# Patient Record
Sex: Male | Born: 1980 | Race: Black or African American | Hispanic: No | Marital: Single | Smoking: Current every day smoker
Health system: Southern US, Community
[De-identification: ages and names within clinical notes are randomized; demographics above are authoritative.]

---

## 2007-12-08 ENCOUNTER — Emergency Department (HOSPITAL_COMMUNITY): Admission: EM | Admit: 2007-12-08 | Discharge: 2007-12-08 | Payer: Self-pay | Admitting: Emergency Medicine

## 2008-07-16 ENCOUNTER — Emergency Department (HOSPITAL_COMMUNITY): Admission: EM | Admit: 2008-07-16 | Discharge: 2008-07-16 | Payer: Self-pay | Admitting: Emergency Medicine

## 2013-12-17 ENCOUNTER — Emergency Department (HOSPITAL_COMMUNITY)
Admission: EM | Admit: 2013-12-17 | Discharge: 2013-12-18 | Disposition: A | Discharge status: Home / Self Care | Payer: Self-pay | Attending: Emergency Medicine | Admitting: Emergency Medicine

## 2013-12-17 ENCOUNTER — Encounter (HOSPITAL_COMMUNITY): Payer: Self-pay | Admitting: Emergency Medicine

## 2013-12-17 ENCOUNTER — Emergency Department (HOSPITAL_COMMUNITY): Payer: Self-pay

## 2013-12-17 DIAGNOSIS — R231 Pallor: Secondary | ICD-10-CM | POA: Insufficient documentation

## 2013-12-17 DIAGNOSIS — R1012 Left upper quadrant pain: Secondary | ICD-10-CM | POA: Insufficient documentation

## 2013-12-17 DIAGNOSIS — R42 Dizziness and giddiness: Secondary | ICD-10-CM | POA: Insufficient documentation

## 2013-12-17 DIAGNOSIS — R519 Headache, unspecified: Secondary | ICD-10-CM

## 2013-12-17 DIAGNOSIS — R61 Generalized hyperhidrosis: Secondary | ICD-10-CM | POA: Insufficient documentation

## 2013-12-17 DIAGNOSIS — R51 Headache: Secondary | ICD-10-CM | POA: Insufficient documentation

## 2013-12-17 DIAGNOSIS — E860 Dehydration: Secondary | ICD-10-CM | POA: Insufficient documentation

## 2013-12-17 DIAGNOSIS — R000 Tachycardia, unspecified: Secondary | ICD-10-CM | POA: Insufficient documentation

## 2013-12-17 LAB — CBC
HEMATOCRIT: 52.6 % — AB (ref 39.0–52.0)
HEMOGLOBIN: 18.3 g/dL — AB (ref 13.0–17.0)
MCH: 33.3 pg (ref 26.0–34.0)
MCHC: 34.8 g/dL (ref 30.0–36.0)
MCV: 95.6 fL (ref 78.0–100.0)
Platelets: 204 10*3/uL (ref 150–400)
RBC: 5.5 MIL/uL (ref 4.22–5.81)
RDW: 12.5 % (ref 11.5–15.5)
WBC: 17.6 10*3/uL — ABNORMAL HIGH (ref 4.0–10.5)

## 2013-12-17 LAB — BASIC METABOLIC PANEL
BUN: 14 mg/dL (ref 6–23)
CHLORIDE: 102 meq/L (ref 96–112)
CO2: 24 mEq/L (ref 19–32)
Calcium: 8.8 mg/dL (ref 8.4–10.5)
Creatinine, Ser: 1.39 mg/dL — ABNORMAL HIGH (ref 0.50–1.35)
GFR, EST AFRICAN AMERICAN: 75 mL/min — AB (ref >90)
GFR, EST NON AFRICAN AMERICAN: 65 mL/min — AB (ref >90)
GLUCOSE: 126 mg/dL — AB (ref 70–99)
POTASSIUM: 4.2 meq/L (ref 3.7–5.3)
SODIUM: 138 meq/L (ref 137–147)

## 2013-12-17 MED ORDER — METOCLOPRAMIDE HCL 5 MG/ML IJ SOLN
10.0000 mg | Freq: Once | INTRAMUSCULAR | Status: AC
  Administered 2013-12-17: 10 mg via INTRAVENOUS
  Filled 2013-12-17: qty 2

## 2013-12-17 MED ORDER — LORAZEPAM 2 MG/ML IJ SOLN
1.0000 mg | Freq: Once | INTRAMUSCULAR | Status: AC
  Administered 2013-12-17: 1 mg via INTRAVENOUS
  Filled 2013-12-17: qty 1

## 2013-12-17 MED ORDER — SODIUM CHLORIDE 0.9 % IV BOLUS (SEPSIS)
1000.0000 mL | Freq: Once | INTRAVENOUS | Status: AC
  Administered 2013-12-17: 1000 mL via INTRAVENOUS

## 2013-12-17 MED ORDER — ONDANSETRON HCL 4 MG/2ML IJ SOLN
4.0000 mg | Freq: Once | INTRAMUSCULAR | Status: AC
  Administered 2013-12-17: 4 mg via INTRAVENOUS

## 2013-12-17 MED ORDER — SODIUM CHLORIDE 0.9 % IV SOLN
Freq: Once | INTRAVENOUS | Status: AC
  Administered 2013-12-17: 20 mL/h via INTRAVENOUS

## 2013-12-17 NOTE — ED Notes (Signed)
Patient ambulates to br with steady gait

## 2013-12-17 NOTE — ED Provider Notes (Signed)
CSN: [id number]     Arrival date & time 12/17/13  2043 History   First MD Initiated Contact with Patient 12/17/13 2045     Chief Complaint  Patient presents with  . Dehydration  . Headache     (Consider location/radiation/quality/duration/timing/severity/associated sxs/prior Treatment) HPI History provided by pt.   Pt was taking a test this afternoon when he suddenly became lightheaded and diaphoretic.  Was experiencing simultaneous palpitations described as rapid HR as well.  A fellow student noticed that he wasn't feeling well and assisted him as he walked outside, and he reportedly had a syncopal episode.  He does not recall whether or not he lost consciousness.  His only complaint currently is that he feels cold.  Donated plasma this morning, though donated in the past and didn't feel this poorly afterwards.  No recent illnesses, including fever, cough, N/V/D, abd pain, urinary sx.  Has not had CP/SOB.  No PMH other than anxiety, but episode today was different than panic attacks he has had in the past.  No past medical history on file. No past surgical history on file. No family history on file. History  Substance Use Topics  . Smoking status: Not on file  . Smokeless tobacco: Not on file  . Alcohol Use: Not on file    Review of Systems  All other systems reviewed and are negative.     Allergies  Review of patient's allergies indicates not on file.  Home Medications   Prior to Admission medications   Not on File   BP 111/74  Pulse 69  Temp(Src) 97.9 F (36.6 C) (Oral)  Resp 18  SpO2 99% Physical Exam  Nursing note and vitals reviewed. Constitutional: He is oriented to person, place, and time. He appears well-developed and well-nourished.  Uncomfortable appearing.    HENT:  Head: Normocephalic and atraumatic.  Mouth/Throat: Oropharynx is clear and moist.  Eyes: Conjunctivae are normal.  Normal appearance  Neck: Normal range of motion.  Cardiovascular: Normal  rate and regular rhythm.   Pulmonary/Chest: Effort normal and breath sounds normal. No respiratory distress.  Abdominal: Soft. Bowel sounds are normal. He exhibits no distension.  Mild tenderness LUQ  Musculoskeletal: Normal range of motion.  Neurological: He is alert and oriented to person, place, and time.  Skin: Skin is warm and dry. There is pallor.  Psychiatric: He has a normal mood and affect. His behavior is normal.    ED Course  Procedures (including critical care time) Labs Review Labs Reviewed  BASIC METABOLIC PANEL - Abnormal; Notable for the following:    Glucose, Bld 126 (*)    Creatinine, Ser 1.39 (*)    GFR calc non Af Amer 65 (*)    GFR calc Af Amer 75 (*)    All other components within normal limits  CBC - Abnormal; Notable for the following:    WBC 17.6 (*)    Hemoglobin 18.3 (*)    HCT 52.6 (*)    All other components within normal limits    Imaging Review Ct Head Wo Contrast  12/18/2013   CLINICAL DATA:  Headaches  EXAM: CT HEAD WITHOUT CONTRAST  TECHNIQUE: Contiguous axial images were obtained from the base of the skull through the vertex without intravenous contrast.  COMPARISON:  07/16/2008  FINDINGS: Bony calvarium is intact. The ventricles are normal in size and configuration. No findings to suggest acute hemorrhage, acute infarction or space-occupying mass lesion are noted.  IMPRESSION: No acute abnormality noted.   Electronically  Signed   By: Alcide CleverMark  Lukens M.D.   On: 12/18/2013 00:11     EKG Interpretation None      MDM   Final diagnoses:  Dehydration  Headache    32yo healthy M presents w/ possible syncope.  Preceded by lightheadedness, diaphoresis and tachycardia.  No CP/SOB.  Donated plasma this morning.  Only complaint currently is feeling cold.  On exam, VS w/in nml range, uncomfortable appearing and pale, nml conjunctiva, well-hydrated, heart w/ RRR, lungs clear.   Pt receiving IVF.  EKG unremarkable.  H&H elevated, as to be expected.  BMP  pending. 9:46 PM   Pt has received 2L NS and VS and symptoms stable.  Complaining of headache.  No focal neuro deficits or meningismus on repeat exam.  Cr elevated, likely secondary to dehydration. Will treat w/ IV reglan and third liter NS.  Dr. Criss AlvineGoldston to see.  11:07 PM   CT head ordered and was negative for acute process.   Results discussed w/ pt.  His headache improved with reglan and then even more so w/ IV toradol.  VSS, he has ambulated to and from bathroom independently and he feels well enough to go home.  I advised rest and fluids.  Return precautions discussed.   Otilio Miuatherine E Ula Couvillon, PA-C 12/18/13 [phone]

## 2013-12-17 NOTE — ED Notes (Addendum)
[facility]. [facility] hot and sweaty, also mentioned HA and nausea". Pt encouraged by bystander to go outside and walk around. Pt awake, poor interaction, poor concentration & communication. Skin W& clammy. NSL placed by EMS. No fluids or meds given. No emesis or vomiting for EMS PTA. VSS. H/o similar in March. Pt since mentions to ED staff that he gave plasma today.

## 2013-12-17 NOTE — ED Notes (Signed)
EDPA into see & assess pt, [facility]

## 2013-12-17 NOTE — ED Notes (Signed)
Pt calmer, resting, RR18, "feels better after vomiting".

## 2013-12-17 NOTE — ED Notes (Addendum)
Sleeping/ resting, arousable to voice and entering room, NAD, calm, VSS, IVF w.o. C/o "feeling cold", won't keep arms straight, some erroneous BPS noted d/t bent arms.

## 2013-12-17 NOTE — ED Notes (Signed)
Pt sitting up diaphoretic, anxious, hyperventilating, c/o "being cold, having HA, BP cuff hurting", pt vomited gastric undigested food.

## 2013-12-17 NOTE — ED Notes (Signed)
Pt calmer, resting on O2 and monitor, VSS, NAD, passively interactive, poor effort at communication, IVF w.o. pending lab results, EDPA CS in to see pt.

## 2013-12-18 MED ORDER — KETOROLAC TROMETHAMINE 30 MG/ML IJ SOLN
30.0000 mg | Freq: Once | INTRAMUSCULAR | Status: AC
  Administered 2013-12-18: 30 mg via INTRAVENOUS
  Filled 2013-12-18: qty 1

## 2013-12-18 NOTE — Discharge Instructions (Signed)
Get rest and drink plenty of fluids.  Take tylenol or ibuprofen as needed for headache.  Return to the ER if you develop fever, worsening headache, chest pain, shortness of breath, more passing out spells.   Dehydration, Adult Dehydration is when you lose more fluids from the body than you take in. Vital organs like the kidneys, brain, and heart cannot function without a proper amount of fluids and salt. Any loss of fluids from the body can cause dehydration.  CAUSES   Vomiting.  Diarrhea.  Excessive sweating.  Excessive urine output.  Fever. SYMPTOMS  Mild dehydration  Thirst.  Dry lips.  Slightly dry mouth. Moderate dehydration  Very dry mouth.  Sunken eyes.  Skin does not bounce back quickly when lightly pinched and released.  Dark urine and decreased urine production.  Decreased tear production.  Headache. Severe dehydration  Very dry mouth.  Extreme thirst.  Rapid, weak pulse (more than 100 beats per minute at rest).  Cold hands and feet.  Not able to sweat in spite of heat and temperature.  Rapid breathing.  Blue lips.  Confusion and lethargy.  Difficulty being awakened.  Minimal urine production.  No tears. DIAGNOSIS  Your caregiver will diagnose dehydration based on your symptoms and your exam. Blood and urine tests will help confirm the diagnosis. The diagnostic evaluation should also identify the cause of dehydration. TREATMENT  Treatment of mild or moderate dehydration can often be done at home by increasing the amount of fluids that you drink. It is best to drink small amounts of fluid more often. Drinking too much at one time can make vomiting worse. Refer to the home care instructions below. Severe dehydration needs to be treated at the hospital where you will probably be given intravenous (IV) fluids that contain water and electrolytes. HOME CARE INSTRUCTIONS   Ask your caregiver about specific rehydration instructions.  Drink enough  fluids to keep your urine clear or pale yellow.  Drink small amounts frequently if you have nausea and vomiting.  Eat as you normally do.  Avoid:  Foods or drinks high in sugar.  Carbonated drinks.  Juice.  Extremely hot or cold fluids.  Drinks with caffeine.  Fatty, greasy foods.  Alcohol.  Tobacco.  Overeating.  Gelatin desserts.  Wash your hands well to avoid spreading bacteria and viruses.  Only take over-the-counter or prescription medicines for pain, discomfort, or fever as directed by your caregiver.  Ask your caregiver if you should continue all prescribed and over-the-counter medicines.  Keep all follow-up appointments with your caregiver. SEEK MEDICAL CARE IF:  You have abdominal pain and it increases or stays in one area (localizes).  You have a rash, stiff neck, or severe headache.  You are irritable, sleepy, or difficult to awaken.  You are weak, dizzy, or extremely thirsty. SEEK IMMEDIATE MEDICAL CARE IF:   You are unable to keep fluids down or you get worse despite treatment.  You have frequent episodes of vomiting or diarrhea.  You have blood or green matter (bile) in your vomit.  You have blood in your stool or your stool looks black and tarry.  You have not urinated in 6 to 8 hours, or you have only urinated a small amount of very dark urine.  You have a fever.  You faint. MAKE SURE YOU:   Understand these instructions.  Will watch your condition.  Will get help right away if you are not doing well or get worse. Document Released: 07/26/2005 Document Revised:  10/18/2011 Document Reviewed: 03/15/2011 ExitCare Patient Information 2014 [place], [place].

## 2013-12-20 NOTE — ED Provider Notes (Signed)
Medical screening examination/treatment/procedure(s) were conducted as a shared visit with non-physician practitioner(s) and myself.  I personally evaluated the patient during the encounter.   EKG Interpretation   Date/Time:  Monday Dec 17 2013 21:35:03 EDT Ventricular Rate:  64 PR Interval:  170 QRS Duration: 91 QT Interval:  414 QTC Calculation: 427 R Axis:   43 Text Interpretation:  Sinus arrhythmia ST elev, probable normal early  repol pattern No old tracing to compare Confirmed by Kodee Ravert  MD, Corby Vandenberghe  ([id number]) on 12/17/2013 9:48:53 PM       Patient with near-syncope vs syncope and a headache. Only thing that worsens headache according to patient is when he is cold/blankets are taken off. Neurologically intact. Most likely feels headache and near-syncopal from giving plasma today and having a test today. Doubt serious acute intracranial pathology causing his headache. Hydrated, will discharge with return precautions.  Audree CamelScott T Drae Mitzel, MD 12/20/13 [phone]

## 2014-01-16 ENCOUNTER — Encounter (HOSPITAL_COMMUNITY): Payer: Self-pay | Admitting: Emergency Medicine

## 2014-01-16 ENCOUNTER — Emergency Department (HOSPITAL_COMMUNITY): Payer: Self-pay

## 2014-01-16 ENCOUNTER — Emergency Department (HOSPITAL_COMMUNITY)
Admission: EM | Admit: 2014-01-16 | Discharge: 2014-01-17 | Disposition: A | Discharge status: Home / Self Care | Payer: Self-pay | Attending: Emergency Medicine | Admitting: Emergency Medicine

## 2014-01-16 DIAGNOSIS — R61 Generalized hyperhidrosis: Secondary | ICD-10-CM | POA: Insufficient documentation

## 2014-01-16 DIAGNOSIS — R51 Headache: Secondary | ICD-10-CM | POA: Insufficient documentation

## 2014-01-16 DIAGNOSIS — E860 Dehydration: Secondary | ICD-10-CM | POA: Insufficient documentation

## 2014-01-16 DIAGNOSIS — F17200 Nicotine dependence, unspecified, uncomplicated: Secondary | ICD-10-CM | POA: Insufficient documentation

## 2014-01-16 LAB — URINALYSIS, ROUTINE W REFLEX MICROSCOPIC
Bilirubin Urine: NEGATIVE
Glucose, UA: NEGATIVE mg/dL
HGB URINE DIPSTICK: NEGATIVE
Ketones, ur: 15 mg/dL — AB
LEUKOCYTES UA: NEGATIVE
Nitrite: NEGATIVE
PH: 6.5 (ref 5.0–8.0)
Protein, ur: NEGATIVE mg/dL
SPECIFIC GRAVITY, URINE: 1.023 (ref 1.005–1.030)
UROBILINOGEN UA: 1 mg/dL (ref 0.0–1.0)

## 2014-01-16 LAB — CBC WITH DIFFERENTIAL/PLATELET
BASOS ABS: 0 10*3/uL (ref 0.0–0.1)
Basophils Relative: 0 % (ref 0–1)
EOS PCT: 0 % (ref 0–5)
Eosinophils Absolute: 0.1 10*3/uL (ref 0.0–0.7)
HEMATOCRIT: 54.7 % — AB (ref 39.0–52.0)
HEMOGLOBIN: 19.4 g/dL — AB (ref 13.0–17.0)
LYMPHS ABS: 2.9 10*3/uL (ref 0.7–4.0)
LYMPHS PCT: 20 % (ref 12–46)
MCH: 33.2 pg (ref 26.0–34.0)
MCHC: 35.5 g/dL (ref 30.0–36.0)
MCV: 93.7 fL (ref 78.0–100.0)
MONO ABS: 1.3 10*3/uL — AB (ref 0.1–1.0)
MONOS PCT: 9 % (ref 3–12)
NEUTROS ABS: 10.5 10*3/uL — AB (ref 1.7–7.7)
Neutrophils Relative %: 71 % (ref 43–77)
Platelets: 225 10*3/uL (ref 150–400)
RBC: 5.84 MIL/uL — ABNORMAL HIGH (ref 4.22–5.81)
RDW: 12.4 % (ref 11.5–15.5)
WBC: 14.7 10*3/uL — AB (ref 4.0–10.5)

## 2014-01-16 LAB — LACTIC ACID, PLASMA
LACTIC ACID, VENOUS: 3.7 mmol/L — AB (ref 0.5–2.2)
Lactic Acid, Venous: 6.3 mmol/L — ABNORMAL HIGH (ref 0.5–2.2)

## 2014-01-16 LAB — BASIC METABOLIC PANEL
BUN: 10 mg/dL (ref 6–23)
BUN: 10 mg/dL (ref 6–23)
CALCIUM: 9.3 mg/dL (ref 8.4–10.5)
CALCIUM: 8.2 mg/dL — AB (ref 8.4–10.5)
CO2: 15 meq/L — AB (ref 19–32)
CO2: 20 meq/L (ref 19–32)
CREATININE: 1.16 mg/dL (ref 0.50–1.35)
Chloride: 99 mEq/L (ref 96–112)
Chloride: 106 mEq/L (ref 96–112)
Creatinine, Ser: 1.3 mg/dL (ref 0.50–1.35)
GFR calc non Af Amer: 70 mL/min — ABNORMAL LOW (ref >90)
GFR, EST AFRICAN AMERICAN: 82 mL/min — AB (ref >90)
GFR, EST NON AFRICAN AMERICAN: 81 mL/min — AB (ref >90)
Glucose, Bld: 160 mg/dL — ABNORMAL HIGH (ref 70–99)
Glucose, Bld: 109 mg/dL — ABNORMAL HIGH (ref 70–99)
Potassium: 3.4 mEq/L — ABNORMAL LOW (ref 3.7–5.3)
Potassium: 4.2 mEq/L (ref 3.7–5.3)
SODIUM: 140 meq/L (ref 137–147)
SODIUM: 139 meq/L (ref 137–147)

## 2014-01-16 LAB — RAPID URINE DRUG SCREEN, HOSP PERFORMED
AMPHETAMINES: NOT DETECTED
BARBITURATES: NOT DETECTED
Benzodiazepines: NOT DETECTED
Cocaine: NOT DETECTED
Opiates: NOT DETECTED
Tetrahydrocannabinol: POSITIVE — AB

## 2014-01-16 LAB — I-STAT CHEM 8, ED
BUN: 9 mg/dL (ref 6–23)
CALCIUM ION: 1.03 mmol/L — AB (ref 1.12–1.23)
CHLORIDE: 104 meq/L (ref 96–112)
CREATININE: 1.3 mg/dL (ref 0.50–1.35)
GLUCOSE: 151 mg/dL — AB (ref 70–99)
HEMATOCRIT: 58 % — AB (ref 39.0–52.0)
Hemoglobin: 19.7 g/dL — ABNORMAL HIGH (ref 13.0–17.0)
POTASSIUM: 2.9 meq/L — AB (ref 3.7–5.3)
Sodium: 137 mEq/L (ref 137–147)
TCO2: 16 mmol/L (ref 0–100)

## 2014-01-16 LAB — ETHANOL

## 2014-01-16 LAB — CBG MONITORING, ED: GLUCOSE-CAPILLARY: 137 mg/dL — AB (ref 70–99)

## 2014-01-16 MED ORDER — ACETAMINOPHEN 500 MG PO TABS
1000.0000 mg | ORAL_TABLET | Freq: Once | ORAL | Status: AC
  Administered 2014-01-16: 1000 mg via ORAL
  Filled 2014-01-16: qty 2

## 2014-01-16 MED ORDER — SODIUM CHLORIDE 0.9 % IV BOLUS (SEPSIS)
1000.0000 mL | Freq: Once | INTRAVENOUS | Status: AC
  Administered 2014-01-16: 1000 mL via INTRAVENOUS

## 2014-01-16 MED ORDER — LORAZEPAM 2 MG/ML IJ SOLN
0.5000 mg | Freq: Once | INTRAMUSCULAR | Status: AC
  Administered 2014-01-16: 0.5 mg via INTRAVENOUS
  Filled 2014-01-16: qty 1

## 2014-01-16 MED ORDER — KETOROLAC TROMETHAMINE 30 MG/ML IJ SOLN
30.0000 mg | Freq: Once | INTRAMUSCULAR | Status: AC
  Administered 2014-01-16: 30 mg via INTRAVENOUS
  Filled 2014-01-16: qty 1

## 2014-01-16 NOTE — ED Notes (Signed)
Pt's cousin Kem Boroughs [phone]

## 2014-01-16 NOTE — ED Notes (Addendum)
Patient yelling out "hello, Hello" Patient stated, "My head hurts so bad and I'm so cold." patient also informed writer that he had donated plasma today. EDPA notified.

## 2014-01-16 NOTE — ED Notes (Signed)
Patient yelled for a nurse to come into the room. Patient stated he had a headache in the middle of his head x 5 days. Patient also elaborated and said he had a history fo migraine headache.

## 2014-01-16 NOTE — ED Notes (Signed)
Pt requested to contact his parents.  Phone given to pt, to speak to his father David Clay

## 2014-01-16 NOTE — ED Notes (Signed)
Assisted pt to a standing position to use the urinal.  No difficulty.  Pt continues to c/o feeling cold.  VS remains WNL.  Pt's brother at bedside.

## 2014-01-16 NOTE — ED Notes (Signed)
Pt was hyperventilating.  Instructed pt to slow his breathing down.

## 2014-01-16 NOTE — ED Notes (Signed)
Per EMS: Pt unwilling/unable to talk about what is wrong.  Pt pale, diaphoretic.  Breathing 70 times per minute upon EMS arrival.  Spasms in hand which have since subsided.  Pt unwilling to follow commands.  Pt was in the shower and walked to the door to let EMS in.  Pt then proceeded to fall to the ground and hyperventilate.  Pt given an emesis bag but proceeded to act like he was going to throw up on the floor.  Pt reacts severely to ammonia inhalant but still will not answer questions.  Normal cbg.

## 2014-01-16 NOTE — ED Notes (Signed)
Family at bedside.  Brother

## 2014-01-16 NOTE — ED Notes (Addendum)
Family's contact is his brother, JT's phone is [phone]

## 2014-01-16 NOTE — ED Provider Notes (Signed)
CSN: [id number]     Arrival date & time 01/16/14  1636 History   First MD Initiated Contact with Patient 01/16/14 1658     Chief Complaint  Patient presents with  . Fatigue  . Diaphoresis      (Consider location/radiation/quality/duration/timing/severity/associated sxs/prior Treatment) The history is provided by the patient and the EMS personnel. The history is limited by the condition of the patient.    David Clay is a 33 y.o. male presenting for evaluation of weakness.  Per ems, he was in the shower at his home when ems first arrived,  He walked to the door, let them in, then fell to the ground hyperventilating, but was unwilling or unable to give history of symptoms.  Upon first arrival,  He was diaphoretic but became chilled and currently has tremors. His only complaint is for a 5 day history of headache.  He initially was not cooperative with giving a history, but did respond appropriately to ammonia inhalant.    Level 5 caveat due to patient condition      History reviewed. No pertinent past medical history. No past surgical history on file. No family history on file. History  Substance Use Topics  . Smoking status: Current Every Day Smoker  . Smokeless tobacco: Not on file  . Alcohol Use: Yes    Review of Systems  Unable to perform ROS Neurological: Positive for headaches.      Allergies  Review of patient's allergies indicates no known allergies.  Home Medications   Prior to Admission medications   Not on File   BP 116/68  Pulse 94  Temp(Src) 97.4 F (36.3 C) (Rectal)  Resp 24  SpO2 100% Physical Exam  Nursing note and vitals reviewed. Constitutional: He appears well-developed and well-nourished. He appears lethargic.  HENT:  Head: Normocephalic and atraumatic.  Eyes: Conjunctivae are normal.  Neck: Normal range of motion.  Cardiovascular: Normal rate, regular rhythm, normal heart sounds and intact distal pulses.   Pulmonary/Chest: Effort normal  and breath sounds normal. He has no wheezes.  Abdominal: Soft. Bowel sounds are normal. There is no tenderness.  Musculoskeletal: Normal range of motion.  Neurological: He appears lethargic. GCS eye subscore is 3.  Skin: Skin is dry and intact. He is not diaphoretic.  Psychiatric: He is withdrawn.    ED Course  Procedures (including critical care time)   2 hours into ed course, patient states he gave plasma today.  Review of chart,  He was here last month with similar sx after giving plasma.  He was given IV fluids,  Will repeat abnormal labs to see if fluids will reverse some of these abnormalities, specifically lactic acid, rbc's.  Will also recheck k+ level to confirm hypokalemia.  Labs Review Labs Reviewed  CBC WITH DIFFERENTIAL - Abnormal; Notable for the following:    WBC 14.7 (*)    RBC 5.84 (*)    Hemoglobin 19.4 (*)    HCT 54.7 (*)    Neutro Abs 10.5 (*)    Monocytes Absolute 1.3 (*)    All other components within normal limits  LACTIC ACID, PLASMA - Abnormal; Notable for the following:    Lactic Acid, Venous 6.3 (*)    All other components within normal limits  URINALYSIS, ROUTINE W REFLEX MICROSCOPIC - Abnormal; Notable for the following:    Color, Urine AMBER (*)    Ketones, ur 15 (*)    All other components within normal limits  URINE RAPID DRUG SCREEN (HOSP PERFORMED) -  Abnormal; Notable for the following:    Tetrahydrocannabinol POSITIVE (*)    All other components within normal limits  BASIC METABOLIC PANEL - Abnormal; Notable for the following:    Potassium 3.4 (*)    CO2 15 (*)    Glucose, Bld 160 (*)    GFR calc non Af Amer 70 (*)    GFR calc Af Amer 82 (*)    All other components within normal limits  LACTIC ACID, PLASMA - Abnormal; Notable for the following:    Lactic Acid, Venous 3.7 (*)    All other components within normal limits  BASIC METABOLIC PANEL - Abnormal; Notable for the following:    Glucose, Bld 109 (*)    Calcium 8.2 (*)    GFR calc non  Af Amer 81 (*)    All other components within normal limits  CBG MONITORING, ED - Abnormal; Notable for the following:    Glucose-Capillary 137 (*)    All other components within normal limits  I-STAT CHEM 8, ED - Abnormal; Notable for the following:    Potassium 2.9 (*)    Glucose, Bld 151 (*)    Calcium, Ion 1.03 (*)    Hemoglobin 19.7 (*)    HCT 58.0 (*)    All other components within normal limits  ETHANOL  LACTIC ACID, PLASMA    Imaging Review Ct Head Wo Contrast  01/16/2014   CLINICAL DATA:  Headache.  Fatigue.  EXAM: CT HEAD WITHOUT CONTRAST  TECHNIQUE: Contiguous axial images were obtained from the base of the skull through the vertex without intravenous contrast.  COMPARISON:  12/17/2013  FINDINGS: No mass lesion. No midline shift. No acute hemorrhage or hematoma. No extra-axial fluid collections. No evidence of acute infarction. Brain parenchyma is normal. Ventricles are normal. No osseous abnormality.  IMPRESSION: Normal exam.   Electronically Signed   By: Geanie Cooley M.D.   On: 01/16/2014 18:24     EKG Interpretation None      MDM   Final diagnoses:  Dehydration     Pt received 3 Liters of NS,  Lactic acid trended to normal range.  He was ambulatory in dept and tolerated PO fluids.  Stable at time of dc.  K+ recheck normal range.  He was stable at time of dc.  Advised to avoid giving plasma in the future, given reaction to this donation.    Burgess Amor, PA-C 01/17/14 [phone]

## 2014-01-17 LAB — LACTIC ACID, PLASMA: Lactic Acid, Venous: 1 mmol/L (ref 0.5–2.2)

## 2014-01-17 NOTE — Discharge Instructions (Signed)
Dehydration, Adult Dehydration is when you lose more fluids from the body than you take in. Vital organs like the kidneys, brain, and heart cannot function without a proper amount of fluids and salt. Any loss of fluids from the body can cause dehydration.  CAUSES   Vomiting.  Diarrhea.  Excessive sweating.  Excessive urine output.  Fever. SYMPTOMS  Mild dehydration  Thirst.  Dry lips.  Slightly dry mouth. Moderate dehydration  Very dry mouth.  Sunken eyes.  Skin does not bounce back quickly when lightly pinched and released.  Dark urine and decreased urine production.  Decreased tear production.  Headache. Severe dehydration  Very dry mouth.  Extreme thirst.  Rapid, weak pulse (more than 100 beats per minute at rest).  Cold hands and feet.  Not able to sweat in spite of heat and temperature.  Rapid breathing.  Blue lips.  Confusion and lethargy.  Difficulty being awakened.  Minimal urine production.  No tears. DIAGNOSIS  Your caregiver will diagnose dehydration based on your symptoms and your exam. Blood and urine tests will help confirm the diagnosis. The diagnostic evaluation should also identify the cause of dehydration. TREATMENT  Treatment of mild or moderate dehydration can often be done at home by increasing the amount of fluids that you drink. It is best to drink small amounts of fluid more often. Drinking too much at one time can make vomiting worse. Refer to the home care instructions below. Severe dehydration needs to be treated at the hospital where you will probably be given intravenous (IV) fluids that contain water and electrolytes. HOME CARE INSTRUCTIONS   Ask your caregiver about specific rehydration instructions.  Drink enough fluids to keep your urine clear or pale yellow.  Drink small amounts frequently if you have nausea and vomiting.  Eat as you normally do.  Avoid:  Foods or drinks high in sugar.  Carbonated  drinks.  Juice.  Extremely hot or cold fluids.  Drinks with caffeine.  Fatty, greasy foods.  Alcohol.  Tobacco.  Overeating.  Gelatin desserts.  Wash your hands well to avoid spreading bacteria and viruses.  Only take over-the-counter or prescription medicines for pain, discomfort, or fever as directed by your caregiver.  Ask your caregiver if you should continue all prescribed and over-the-counter medicines.  Keep all follow-up appointments with your caregiver. SEEK MEDICAL CARE IF:  You have abdominal pain and it increases or stays in one area (localizes).  You have a rash, stiff neck, or severe headache.  You are irritable, sleepy, or difficult to awaken.  You are weak, dizzy, or extremely thirsty. SEEK IMMEDIATE MEDICAL CARE IF:   You are unable to keep fluids down or you get worse despite treatment.  You have frequent episodes of vomiting or diarrhea.  You have blood or green matter (bile) in your vomit.  You have blood in your stool or your stool looks black and tarry.  You have not urinated in 6 to 8 hours, or you have only urinated a small amount of very dark urine.  You have a fever.  You faint. MAKE SURE YOU:   Understand these instructions.  Will watch your condition.  Will get help right away if you are not doing well or get worse. Document Released: 07/26/2005 Document Revised: 10/18/2011 Document Reviewed: 03/15/2011 [facility]ExitCare Patient Information 2014 [place], [place].   Your symptoms today are directly related to donating plasma, which you do not tolerate well and is not healthy for you.  You should not donate  plasma again.

## 2014-01-17 NOTE — ED Notes (Signed)
Pt was ambulatory to bathroom with steady gait. Breathing was unlabored and even. Pt states he feels better, not as cold as earlier and only has a slight headache now.

## 2014-01-20 NOTE — ED Provider Notes (Signed)
Medical screening examination/treatment/procedure(s) were conducted as a shared visit with non-physician practitioner(s) and myself.  I personally evaluated the patient during the encounter.   EKG Interpretation None       Patient with headache and feeling bad after giving plasma. Similar event 1 month ago. Symptoms improved with fluid rehydration. Initially had lactic acidosis, will recheck after fluids and discharge.  Audree CamelScott T Ashlyne Olenick, MD 01/20/14 [phone]

## 2014-09-16 ENCOUNTER — Ambulatory Visit: Payer: Self-pay

## 2015-05-01 IMAGING — CT CT HEAD W/O CM
2 series · 16 of 30 positions shown, 20 images · non-contrast
Comparison: 07/16/2008

CLINICAL DATA: Headaches

EXAM:
CT HEAD WITHOUT CONTRAST
TECHNIQUE: Contiguous axial images were obtained from the base of the skull
through the vertex without intravenous contrast.

[Series 201: head w/o, idose (1) · axial · non-contrast · 0.45mm/px · z∈[+106,+231]mm · 13 of 31 slices shown, 17 images]
[im 3/31  brain]
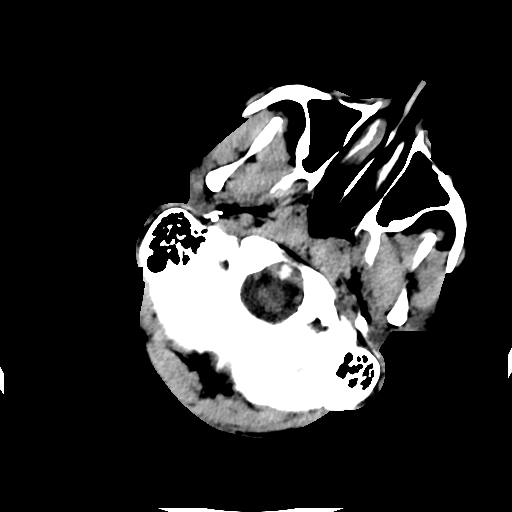
[im 3/31  bone]
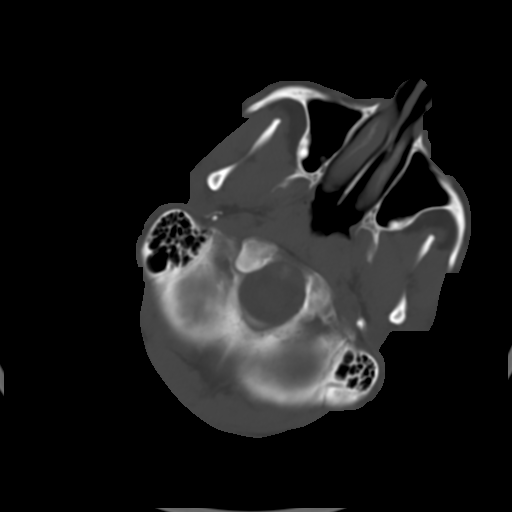
[im 5/31  brain]
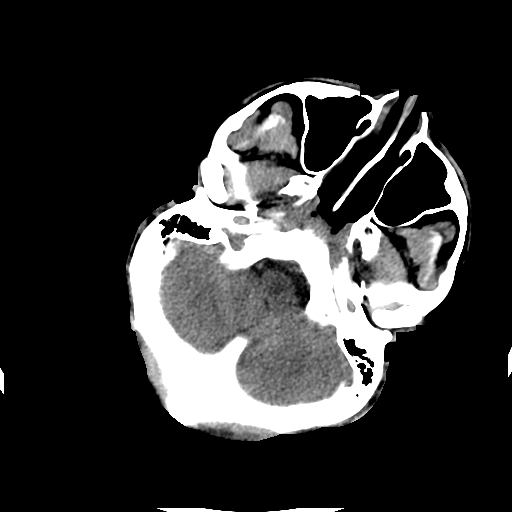
[im 7/31  brain]
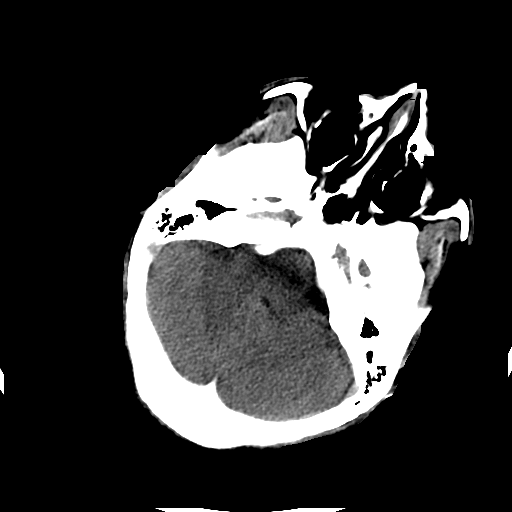
[im 9/31  brain]
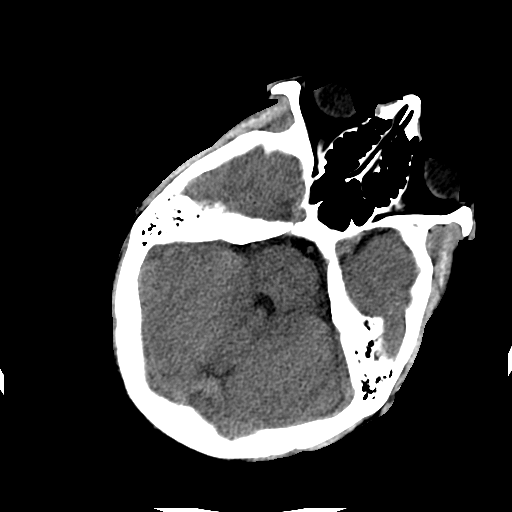
[im 11/31  brain]
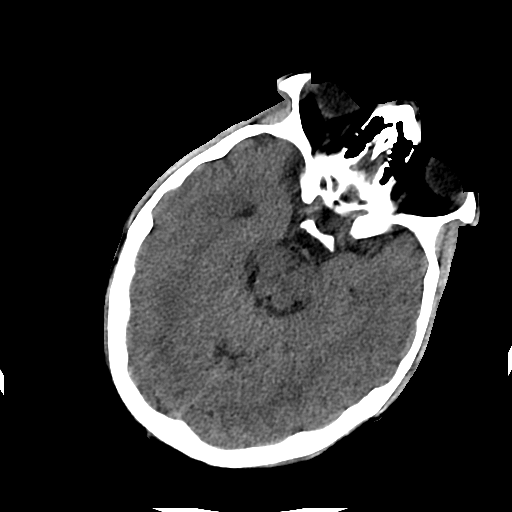
[im 11/31  bone]
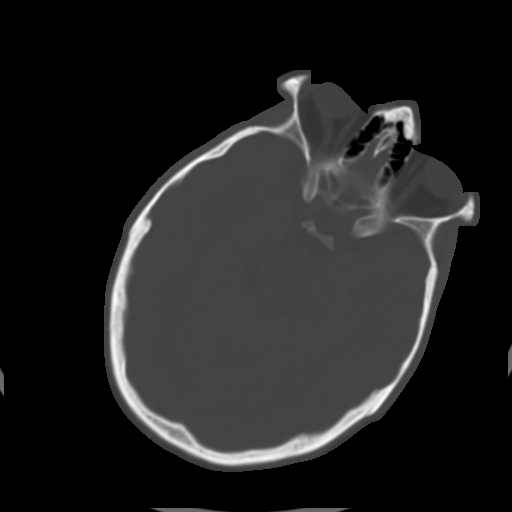
[im 13/31  brain]
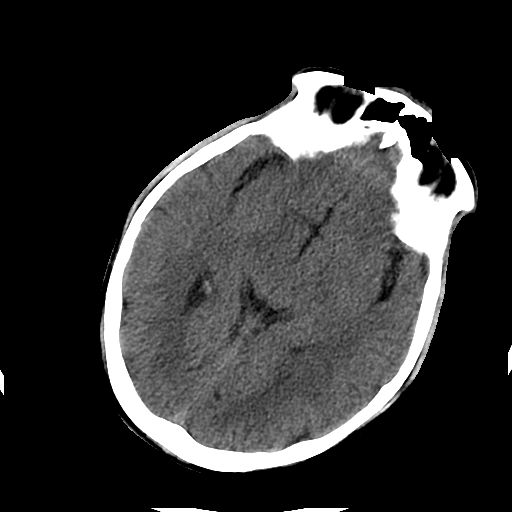
[im 16/31  brain]
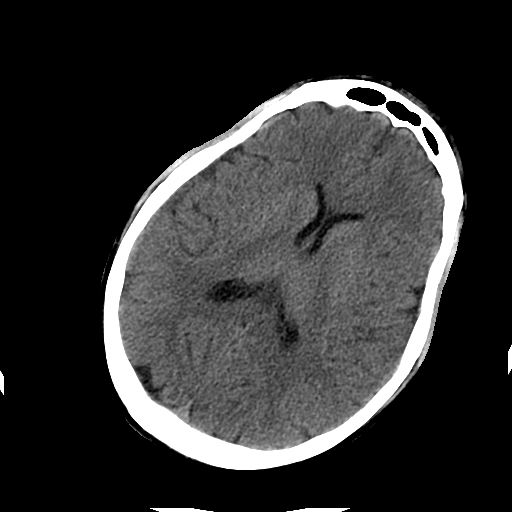
[im 18/31  brain]
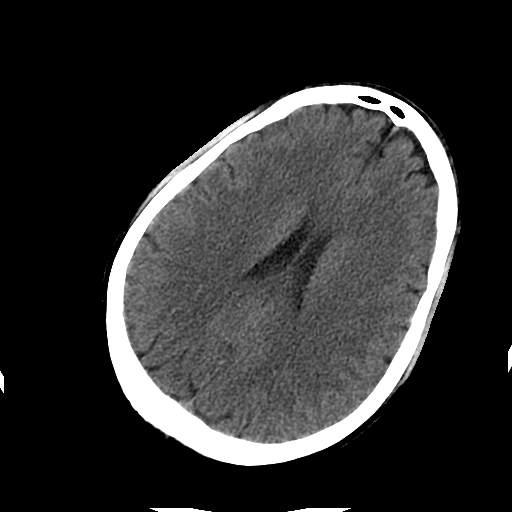
[im 20/31  brain]
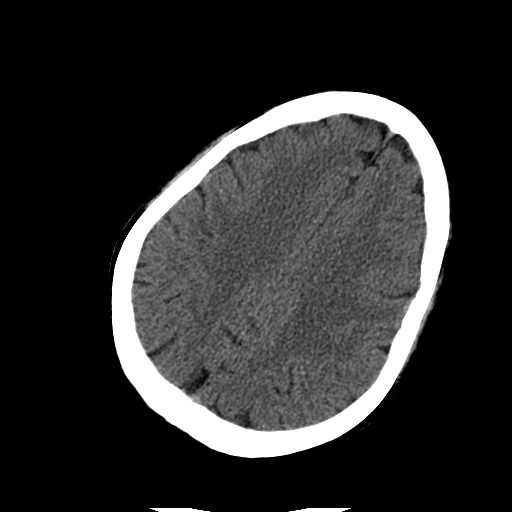
[im 20/31  bone]
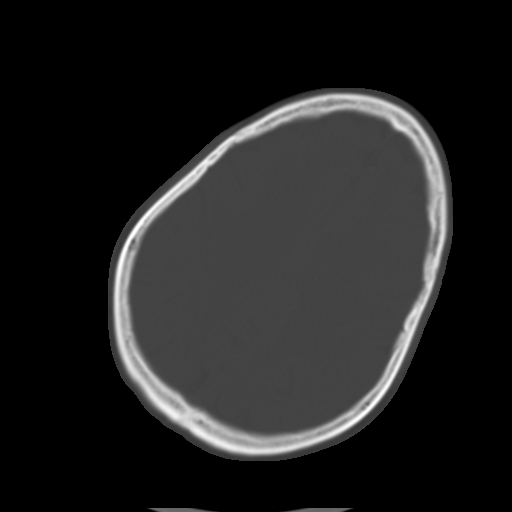
[im 22/31  brain]
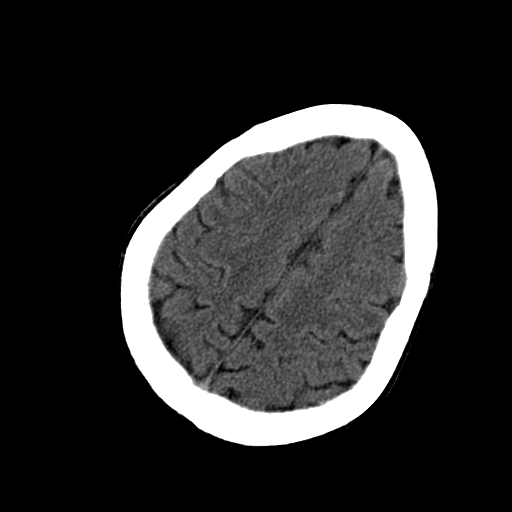
[im 24/31  brain]
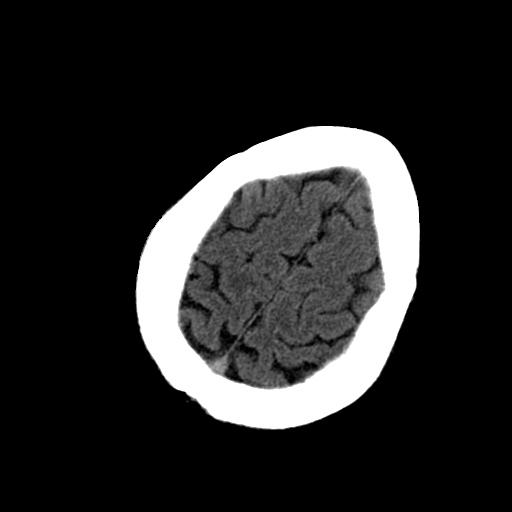
[im 26/31  brain]
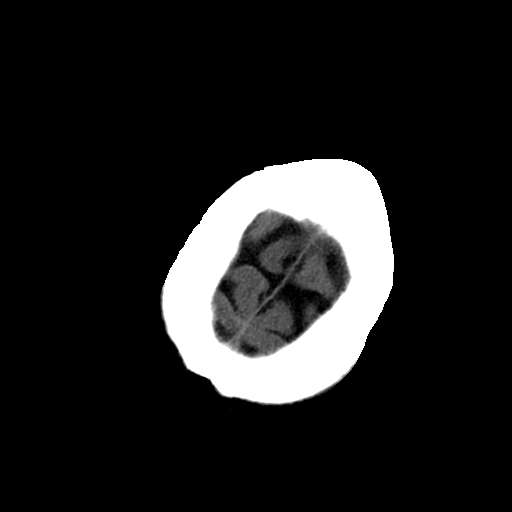
[im 28/31  brain]
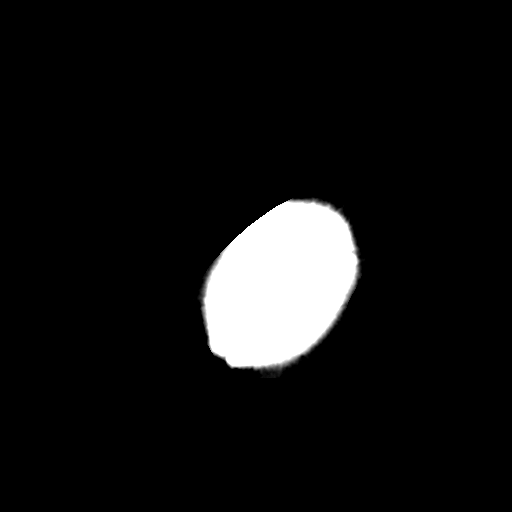
[im 28/31  bone]
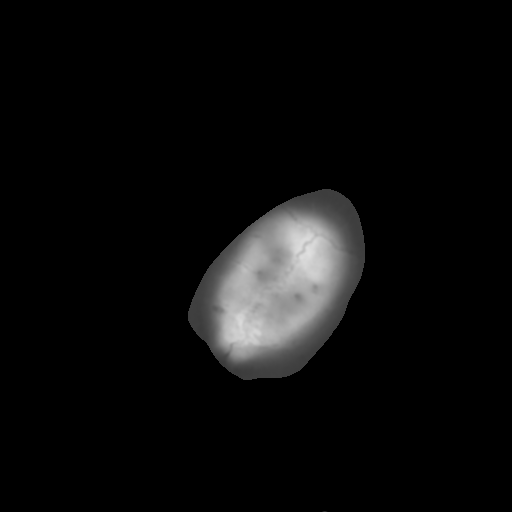

[Series 202: head w/o bone, idose (1) · axial · non-contrast · 0.45mm/px · z∈[+106,+146]mm · 3 of 31 slices shown]
[im 3/31  bone]
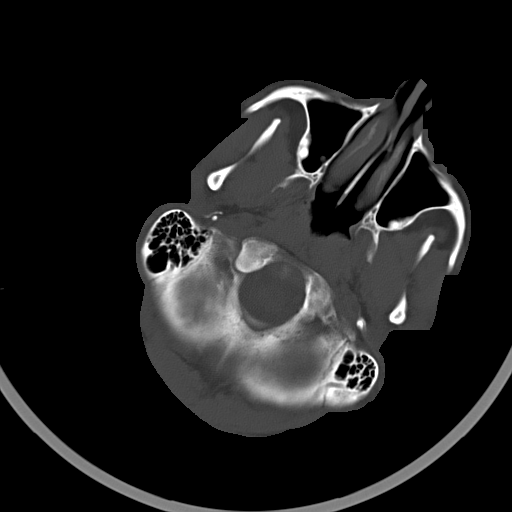
[im 7/31  bone]
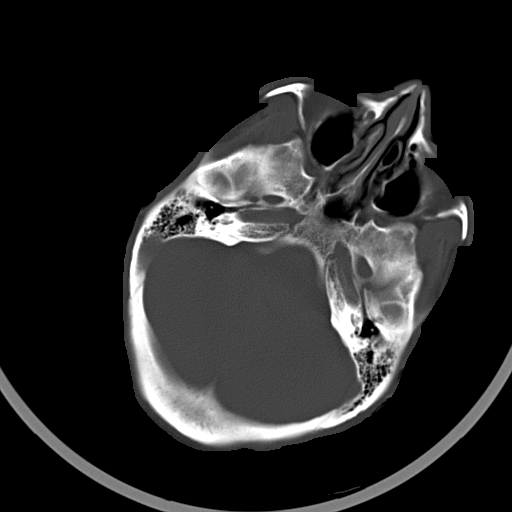
[im 11/31  bone]
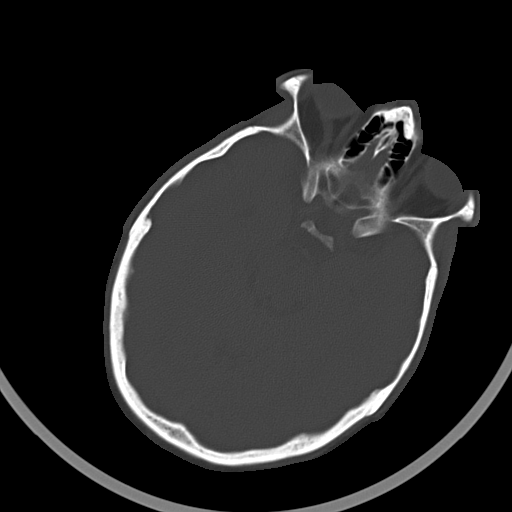

[16 of 30 positions shown; findings below may reference images not displayed]

FINDINGS: Bony calvarium is intact. The ventricles are normal in size and
configuration. No findings to suggest acute hemorrhage, acute
infarction or space-occupying mass lesion are noted.
IMPRESSION: No acute abnormality noted.

## 2015-05-31 IMAGING — CT CT HEAD W/O CM
2 series · 16 of 30 positions shown, 20 images · non-contrast
Comparison: 12/17/2013

CLINICAL DATA: Headache.  Fatigue.

EXAM:
CT HEAD WITHOUT CONTRAST
TECHNIQUE: Contiguous axial images were obtained from the base of the skull
through the vertex without intravenous contrast.

[Series 2: head w/o · axial · non-contrast · 0.48mm/px · z∈[-2,+118]mm · 13 of 30 slices shown, 17 images]
[im 3/30  brain]
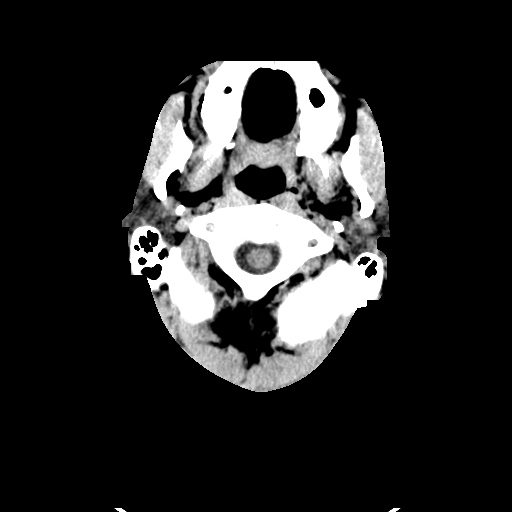
[im 3/30  bone]
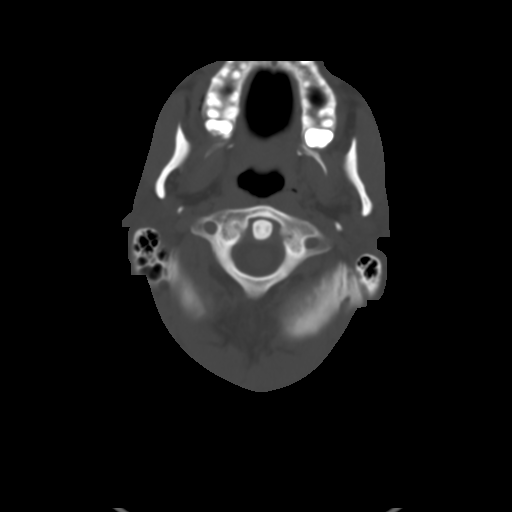
[im 5/30  brain]
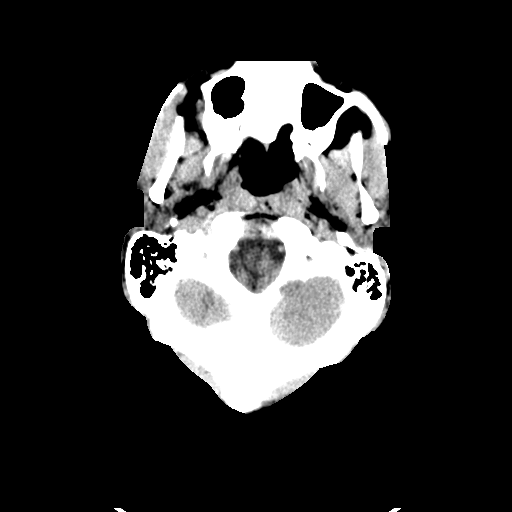
[im 7/30  brain]
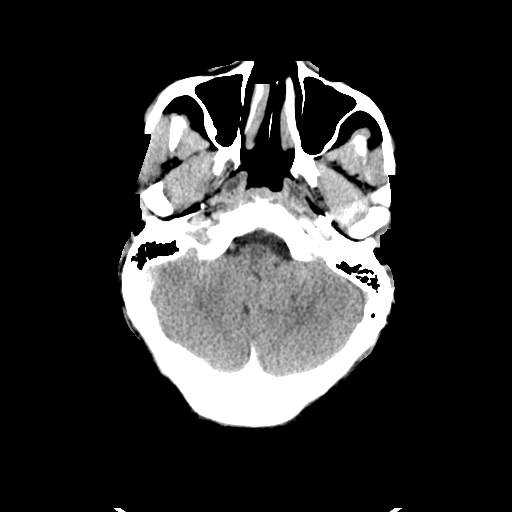
[im 9/30  brain]
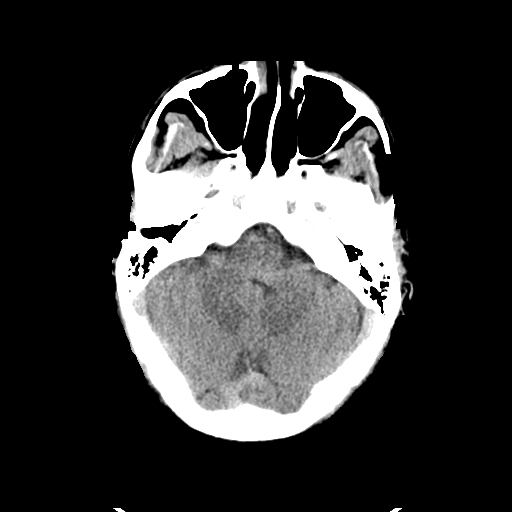
[im 11/30  brain]
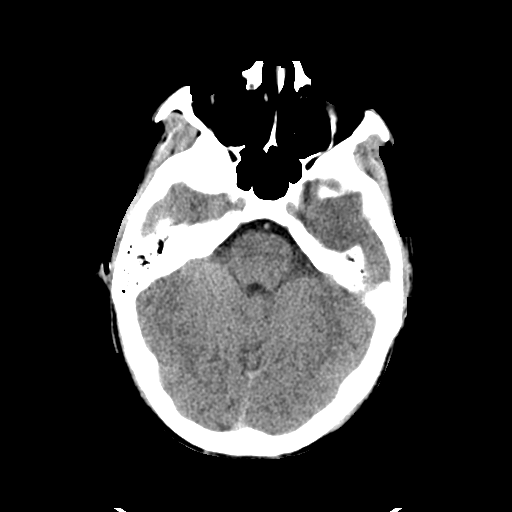
[im 11/30  bone]
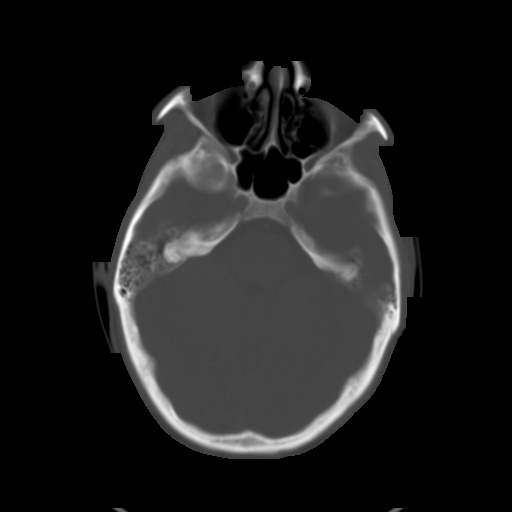
[im 13/30  brain]
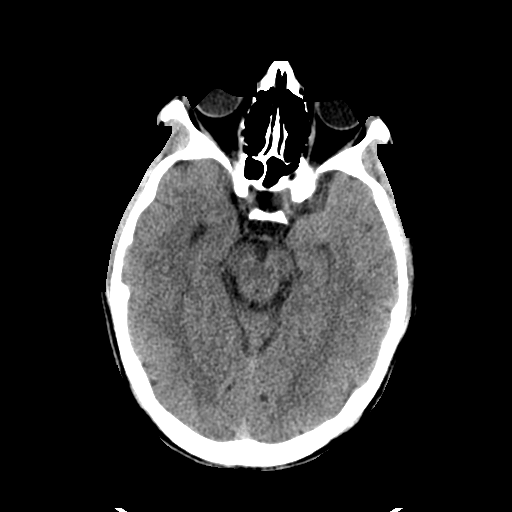
[im 15/30  brain]
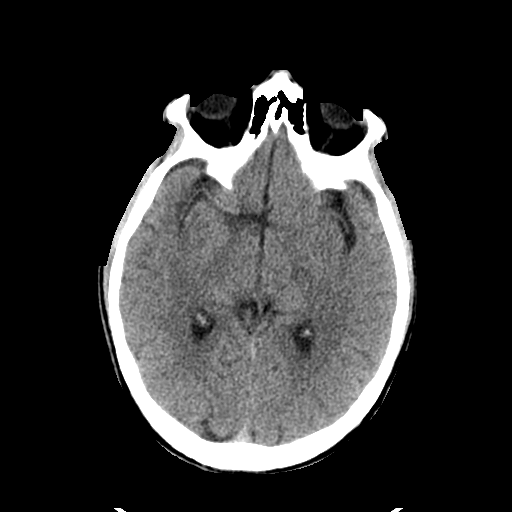
[im 17/30  brain]
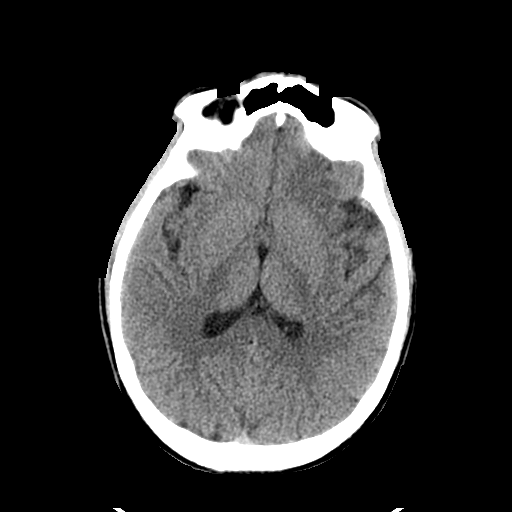
[im 19/30  brain]
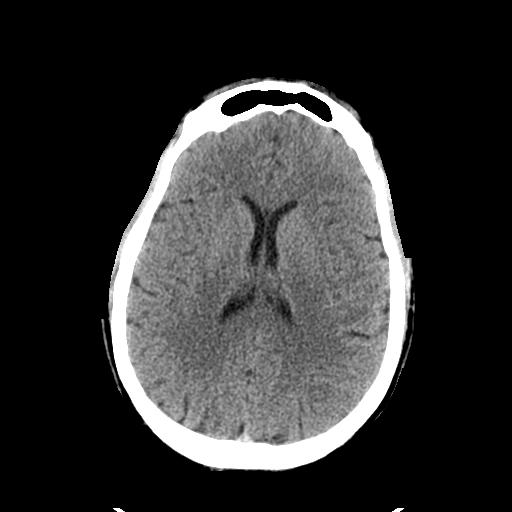
[im 19/30  bone]
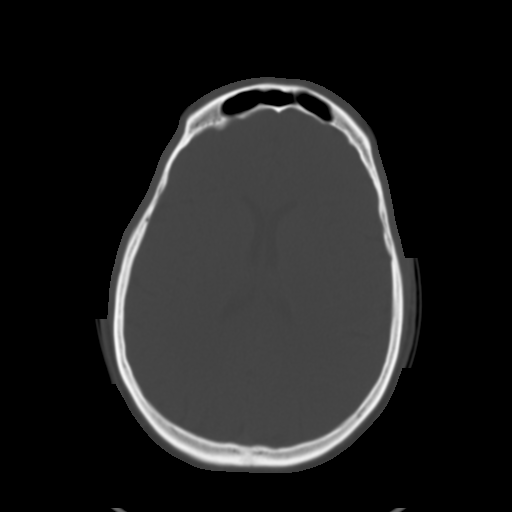
[im 21/30  brain]
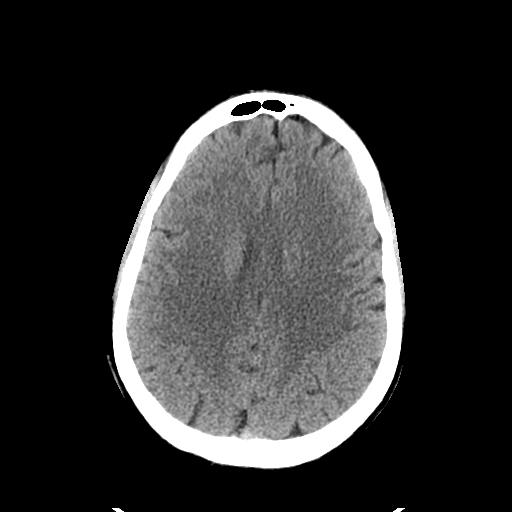
[im 23/30  brain]
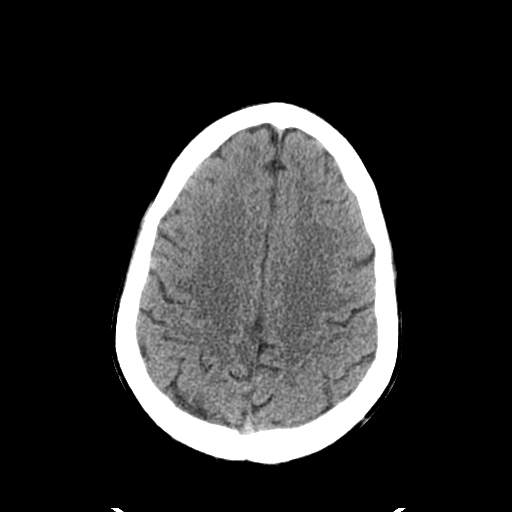
[im 25/30  brain]
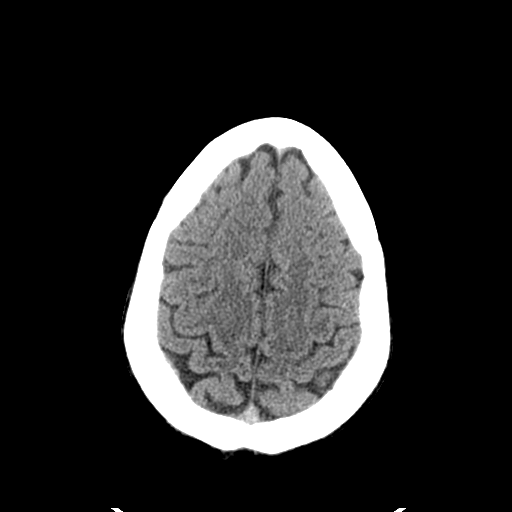
[im 27/30  brain]
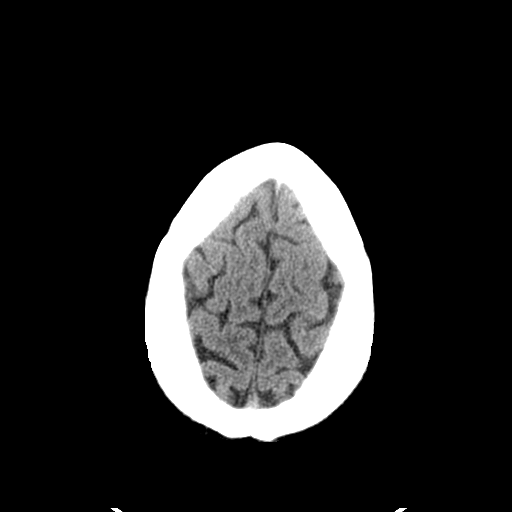
[im 27/30  bone]
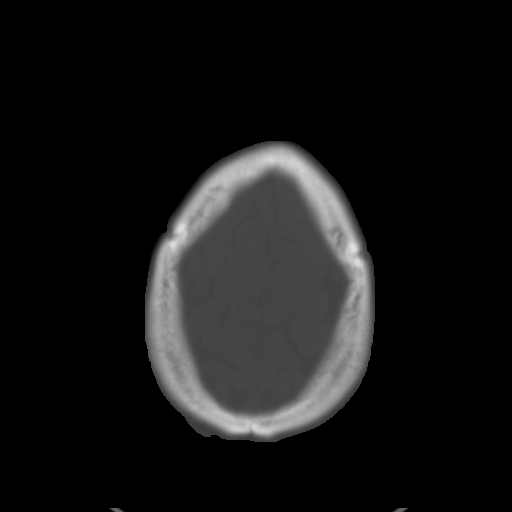

[Series 3: bone windows · axial · 0.48mm/px · z∈[-2,+38]mm · 3 of 30 slices shown]
[im 3/30  bone]
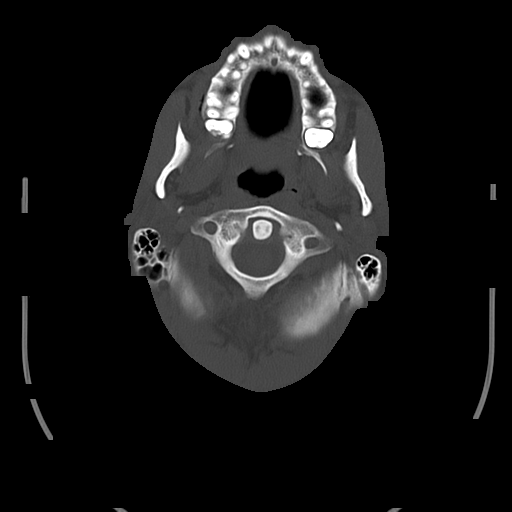
[im 7/30  bone]
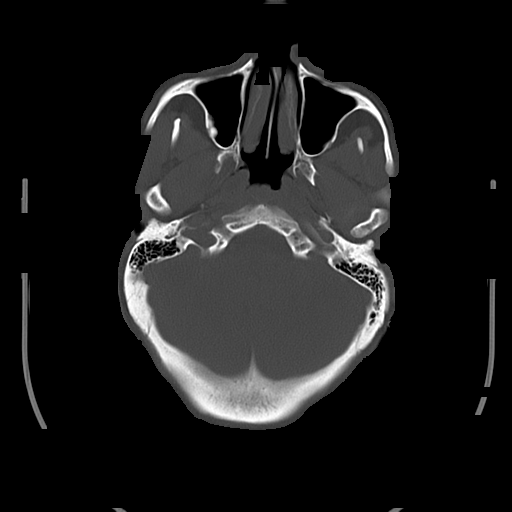
[im 11/30  bone]
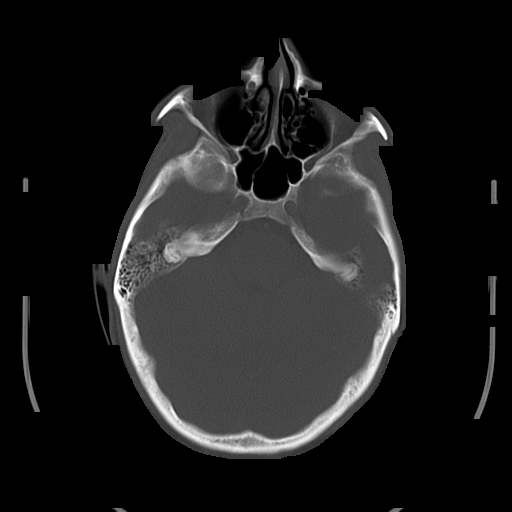

[16 of 30 positions shown; findings below may reference images not displayed]

FINDINGS: No mass lesion. No midline shift. No acute hemorrhage or hematoma.
No extra-axial fluid collections. No evidence of acute infarction.
Brain parenchyma is normal. Ventricles are normal. No osseous
abnormality.
IMPRESSION: Normal exam.

## 2016-04-23 ENCOUNTER — Encounter (HOSPITAL_COMMUNITY): Payer: Self-pay | Admitting: Emergency Medicine

## 2016-04-23 ENCOUNTER — Emergency Department (HOSPITAL_COMMUNITY)
Admission: EM | Admit: 2016-04-23 | Discharge: 2016-04-23 | Disposition: A | Discharge status: Home / Self Care | Payer: Self-pay | Attending: Emergency Medicine | Admitting: Emergency Medicine

## 2016-04-23 DIAGNOSIS — F17200 Nicotine dependence, unspecified, uncomplicated: Secondary | ICD-10-CM | POA: Insufficient documentation

## 2016-04-23 DIAGNOSIS — K610 Anal abscess: Secondary | ICD-10-CM | POA: Insufficient documentation

## 2016-04-23 DIAGNOSIS — L304 Erythema intertrigo: Secondary | ICD-10-CM | POA: Insufficient documentation

## 2016-04-23 DIAGNOSIS — R21 Rash and other nonspecific skin eruption: Secondary | ICD-10-CM

## 2016-04-23 MED ORDER — TRIAMCINOLONE ACETONIDE 0.1 % EX CREA: 1.0000 "application " | TOPICAL_CREAM | Freq: Two times a day (BID) | CUTANEOUS | 0 refills | Status: AC

## 2016-04-23 MED ORDER — CLOTRIMAZOLE 1 % EX CREA: TOPICAL_CREAM | CUTANEOUS | 0 refills | Status: AC

## 2016-04-23 MED ORDER — PERMETHRIN 5 % EX CREA: TOPICAL_CREAM | CUTANEOUS | 0 refills | Status: AC

## 2016-04-23 MED ORDER — LIDOCAINE HCL 2 % IJ SOLN
INTRAMUSCULAR | Status: AC
  Administered 2016-04-23: 400 mg
  Filled 2016-04-23: qty 20

## 2016-04-23 MED ORDER — LIDOCAINE-EPINEPHRINE-TETRACAINE (LET) SOLUTION
3.0000 mL | Freq: Once | NASAL | Status: AC
  Administered 2016-04-23: 3 mL via TOPICAL
  Filled 2016-04-23: qty 3

## 2016-04-23 MED ORDER — LIDOCAINE-PRILOCAINE 2.5-2.5 % EX CREA
TOPICAL_CREAM | Freq: Once | CUTANEOUS | Status: DC
  Filled 2016-04-23: qty 5

## 2016-04-23 MED ORDER — LIDOCAINE-EPINEPHRINE 2 %-1:100000 IJ SOLN: 10.0000 mL | Freq: Once | INTRAMUSCULAR | Status: DC

## 2016-04-23 NOTE — ED Triage Notes (Signed)
Pt c/o pruritic rash to buttocks arms and flank x 1 month, non painful, no drainage. Pt states now he has an nondraining abscess that has appeared to gluteal fold 3 days ago.

## 2016-04-23 NOTE — ED Provider Notes (Signed)
[facility]-EMERGENCY DEPT Provider Note   CSN: [id number] Arrival date & time: 04/23/16  1924     History   Chief Complaint Chief Complaint  Patient presents with  . Rash    HPI David Clay is a 35 y.o. male with hx of rash on arms, legs, and flank. He also complains of developing abscess around his anus. It hurts to wipe, but not to make a bm. He has not had one like this before.  Denies contacts with similar,changes in lotions/soaps/detergents, exposure to animal or plant irritants, and denies purulent discharge. Denies fevers, chills, myalgias, arthralgias. Denies DOE, SOB, chest tightness or pressure, radiation to left arm, jaw or back, or diaphoresis. Denies dysuria, flank pain, suprapubic pain, frequency, urgency, or hematuria. Denies headaches, light headedness, weakness, visual disturbances. Denies abdominal pain, nausea, vomiting, diarrhea or constipation.    HPI  History reviewed. No pertinent past medical history.  There are no active problems to display for this patient.   History reviewed. No pertinent surgical history.     Home Medications    Prior to Admission medications   Medication Sig Start Date End Date Taking? Authorizing Provider  clotrimazole (LOTRIMIN) 1 % cream Apply to affected area 2 times daily 04/23/16   Arthor Captain, PA-C  permethrin (ELIMITE) 5 % cream Apply to affected area once 04/23/16   Arthor Captain, PA-C  triamcinolone cream (KENALOG) 0.1 % Apply 1 application topically 2 (two) times daily. 04/23/16   Arthor Captain, PA-C    Family History No family history on file.  Social History Social History  Substance Use Topics  . Smoking status: Current Every Day Smoker  . Smokeless tobacco: Never Used  . Alcohol use Yes     Allergies   Review of patient's allergies indicates no known allergies.   Review of Systems Review of Systems  Ten systems reviewed and are negative for acute change, except as noted in the HPI.   Physical  Exam Updated Vital Signs BP 121/76 (BP Location: Left Arm)   Pulse 71   Temp 98.2 F (36.8 C) (Oral)   Resp 18   SpO2 98%   Physical Exam  Constitutional: He appears well-developed and well-nourished. No distress.  HENT:  Head: Normocephalic and atraumatic.  Eyes: Conjunctivae are normal. No scleral icterus.  Neck: Normal range of motion. Neck supple.  Cardiovascular: Normal rate, regular rhythm and normal heart sounds.   Pulmonary/Chest: Effort normal and breath sounds normal. No respiratory distress.  Abdominal: Soft. There is no tenderness.  Musculoskeletal: He exhibits no edema.  Neurological: He is alert.  Skin: Skin is warm and dry. He is not diaphoretic.  Areas of confluent, hyperpigmented, papular lesions on the trunk, thighs and arms. Areas or excoriation present.  The gluteal cleft is hyperpigmented with fine scale, suggestive of intertrigo. There is a  4 cm area of erythema and fluctuance at 4:00. TTP.  Psychiatric: His behavior is normal.  Nursing note and vitals reviewed.    ED Treatments / Results  Labs (all labs ordered are listed, but only abnormal results are displayed) Labs Reviewed - No data to display  EKG  EKG Interpretation None       Radiology No results found.  Procedures .Marland KitchenIncision and Drainage Date/Time: 04/26/2016 4:42 PM Performed by: Arthor Captain Authorized by: Arthor Captain   Consent:    Consent obtained:  Verbal   Consent given by:  Patient   Risks discussed:  Pain, incomplete drainage and bleeding Location:    Type:  Abscess   Size:  4   Location:  Anogenital   Anogenital location:  Perianal Pre-procedure details:    Skin preparation:  Betadine Anesthesia (see MAR for exact dosages):    Anesthesia method:  Local infiltration   Local anesthetic:  Lidocaine 2% WITH epi Procedure type:    Complexity:  Simple Procedure details:    Needle aspiration: yes     Needle size:  18 G   Incision types:  Single straight    Incision depth:  Dermal   Scalpel blade:  11   Wound management:  Irrigated with saline, probed and deloculated and extensive cleaning   Drainage:  Purulent   Drainage amount:  Copious   Wound treatment:  Wound left open   Packing materials:  None Post-procedure details:    Patient tolerance of procedure:  Tolerated well, no immediate complications   (including critical care time)  Medications Ordered in ED Medications  lidocaine-EPINEPHrine-tetracaine (LET) solution (3 mLs Topical Given 04/23/16 2139)  lidocaine (XYLOCAINE) 2 % (with pres) injection (400 mg  Given 04/23/16 2157)     Initial Impression / Assessment and Plan / ED Course  I have reviewed the triage vital signs and the nursing notes.  Pertinent labs & imaging results that were available during my care of the patient were reviewed by me and considered in my medical decision making (see chart for details).  Clinical Course    Patient will be d/c with treatment for scabies and intertrigo. Patient with skin abscess amenable to incision and drainage.  Abscess was not large enough to warrant packing or drain,  wound recheck in 2 days. Encouraged home warm soaks and flushing.  Mild signs of cellulitis is surrounding skin.  Will d/c to home.  No antibiotic therapy is indicated.   Final Clinical Impressions(s) / ED Diagnoses   Final diagnoses:  Perianal abscess  Rash  Intertrigo    New Prescriptions Discharge Medication List as of 04/23/2016 10:48 PM    START taking these medications   Details  clotrimazole (LOTRIMIN) 1 % cream Apply to affected area 2 times daily, Print    permethrin (ELIMITE) 5 % cream Apply to affected area once, Print    triamcinolone cream (KENALOG) 0.1 % Apply 1 application topically 2 (two) times daily., Starting Fri 04/23/2016, Print         SeminoleAbigail Sangeeta Youse, PA-C 04/26/16 1645    Alvira MondayErin Schlossman, MD 04/28/16 [phone]

## 2016-09-23 ENCOUNTER — Encounter (HOSPITAL_COMMUNITY): Payer: Self-pay

## 2016-09-23 ENCOUNTER — Emergency Department (HOSPITAL_COMMUNITY)
Admission: EM | Admit: 2016-09-23 | Discharge: 2016-09-24 | Disposition: A | Discharge status: Home / Self Care | Payer: Self-pay | Attending: Physician Assistant | Admitting: Physician Assistant

## 2016-09-23 DIAGNOSIS — L299 Pruritus, unspecified: Secondary | ICD-10-CM | POA: Insufficient documentation

## 2016-09-23 DIAGNOSIS — F17200 Nicotine dependence, unspecified, uncomplicated: Secondary | ICD-10-CM | POA: Insufficient documentation

## 2016-09-23 DIAGNOSIS — Z79899 Other long term (current) drug therapy: Secondary | ICD-10-CM | POA: Insufficient documentation

## 2016-09-23 MED ORDER — DEXAMETHASONE SODIUM PHOSPHATE 10 MG/ML IJ SOLN
10.0000 mg | Freq: Once | INTRAMUSCULAR | Status: AC
  Administered 2016-09-24: 10 mg via INTRAMUSCULAR
  Filled 2016-09-23: qty 1

## 2016-09-23 NOTE — ED Triage Notes (Signed)
Pt presents with c/o pruritis. Pt reports his whole body except for his face, genitals, and feet, has been itching since last August. Pt denies seeing a doctor for this.

## 2016-09-24 LAB — COMPREHENSIVE METABOLIC PANEL
ALT: 17 U/L (ref 17–63)
AST: 26 U/L (ref 15–41)
Albumin: 3.4 g/dL — ABNORMAL LOW (ref 3.5–5.0)
Alkaline Phosphatase: 54 U/L (ref 38–126)
Anion gap: 6 (ref 5–15)
BUN: 13 mg/dL (ref 6–20)
CO2: 27 mmol/L (ref 22–32)
Calcium: 8.4 mg/dL — ABNORMAL LOW (ref 8.9–10.3)
Chloride: 103 mmol/L (ref 101–111)
Creatinine, Ser: 1.45 mg/dL — ABNORMAL HIGH (ref 0.61–1.24)
GFR calc Af Amer: >60 mL/min (ref >60)
GFR calc non Af Amer: >60 mL/min (ref >60)
Glucose, Bld: 110 mg/dL — ABNORMAL HIGH (ref 65–99)
Potassium: 3.6 mmol/L (ref 3.5–5.1)
Sodium: 136 mmol/L (ref 135–145)
Total Bilirubin: 0.6 mg/dL (ref 0.3–1.2)
Total Protein: 6 g/dL — ABNORMAL LOW (ref 6.5–8.1)

## 2016-09-24 MED ORDER — HYDROXYZINE HCL 25 MG PO TABS: 25.0000 mg | ORAL_TABLET | Freq: Four times a day (QID) | ORAL | 0 refills | Status: AC

## 2016-09-24 MED ORDER — PREDNISONE 50 MG PO TABS: 50.0000 mg | ORAL_TABLET | Freq: Every day | ORAL | 0 refills | Status: AC

## 2016-09-24 NOTE — ED Provider Notes (Signed)
[facility] DEPT Provider Note   CSN: [id number] Arrival date & time: 09/23/16  2321     History   Chief Complaint Chief Complaint  Patient presents with  . Pruritis    HPI David Clay is a 36 y.o. male.  HPI Patient presents to the emergency department with itching and rash since August.  The patient states that rash is spread over that timeframe. Patient states that he was seen in September and given medications for the rash.  She states that nothing seemed to make the condition better or worse.  The patient states that he has not seen anyone else in follow-up for this. The patient denies chest pain, shortness of breath, headache,blurred vision, neck pain, fever, cough, weakness, numbness, dizziness, anorexia, edema, abdominal pain, nausea, vomiting, diarrhea,  back pain, dysuria, hematemesis, bloody stool, near syncope, or syncope. History reviewed. No pertinent past medical history.  There are no active problems to display for this patient.   History reviewed. No pertinent surgical history.     Home Medications    Prior to Admission medications   Medication Sig Start Date End Date Taking? Authorizing Provider  clotrimazole (LOTRIMIN) 1 % cream Apply to affected area 2 times daily 04/23/16   Arthor Captain, PA-C  permethrin (ELIMITE) 5 % cream Apply to affected area once 04/23/16   Arthor Captain, PA-C  triamcinolone cream (KENALOG) 0.1 % Apply 1 application topically 2 (two) times daily. 04/23/16   Arthor Captain, PA-C    Family History No family history on file.  Social History Social History  Substance Use Topics  . Smoking status: Current Every Day Smoker  . Smokeless tobacco: Never Used  . Alcohol use Yes     Allergies   Patient has no known allergies.   Review of Systems Review of Systems  All other systems negative except as documented in the HPI. All pertinent positives and negatives as reviewed in the HPI.  Physical Exam Updated Vital  Signs BP 134/78 (BP Location: Left Arm)   Pulse 77   Temp 98 F (36.7 C) (Oral)   Resp 18   Ht 5\' 7"  (1.702 m)   Wt 65.3 kg   SpO2 95%   BMI 22.55 kg/m   Physical Exam  Constitutional: He is oriented to person, place, and time. He appears well-developed and well-nourished. No distress.  HENT:  Head: Normocephalic and atraumatic.  Mouth/Throat: Oropharynx is clear and moist.  Eyes: Pupils are equal, round, and reactive to light.  Neck: Normal range of motion. Neck supple.  Cardiovascular: Normal rate, regular rhythm and normal heart sounds.  Exam reveals no gallop and no friction rub.   No murmur heard. Pulmonary/Chest: Effort normal and breath sounds normal. No respiratory distress. He has no wheezes.  Abdominal: Soft. Bowel sounds are normal. He exhibits no distension. There is no tenderness.  Neurological: He is alert and oriented to person, place, and time. He exhibits normal muscle tone. Coordination normal.  Skin: Skin is warm and dry. Capillary refill takes less than 2 seconds. Rash noted. No erythema.  Patient has a diffuse raised rash throughout most of his body.  He does not have a rash on the face.  The palms, soles.   Psychiatric: He has a normal mood and affect. His behavior is normal.  Nursing note and vitals reviewed.    ED Treatments / Results  Labs (all labs ordered are listed, but only abnormal results are displayed) Labs Reviewed  COMPREHENSIVE METABOLIC PANEL - Abnormal; Notable for  the following:       Result Value   Glucose, Bld 110 (*)    Creatinine, Ser 1.45 (*)    Calcium 8.4 (*)    Total Protein 6.0 (*)    Albumin 3.4 (*)    All other components within normal limits    EKG  EKG Interpretation None       Radiology No results found.  Procedures Procedures (including critical care time)  Medications Ordered in ED Medications  dexamethasone (DECADRON) injection 10 mg (10 mg Intramuscular Given 09/24/16 0012)     Initial Impression /  Assessment and Plan / ED Course  I have reviewed the triage vital signs and the nursing notes.  Pertinent labs & imaging results that were available during my care of the patient were reviewed by me and considered in my medical decision making (see chart for details).    The patient is referred to tolerate due to the fact that this rash is been ongoing for quite some time.  I advised the patient that this point, we will need further assistance in delineating the cause of this rash.  Patient was treated as an allergic/scabies rash in September.  I have given him treatment with steroids and hydroxyzine.   Final Clinical Impressions(s) / ED Diagnoses   Final diagnoses:  None    New Prescriptions New Prescriptions   No medications on file     Charlestine NightChristopher Indiana Gamero, PA-C 09/27/16 [phone]    Courteney Randall AnLyn Mackuen, MD 09/28/16 1451

## 2016-09-24 NOTE — Discharge Instructions (Signed)
Return here needed. Follow up with the doctor provided.

## 2016-09-24 NOTE — ED Notes (Signed)
Patient d/c'd self care.  F/U and medications reviewed.  Patient verbalized understanding. 

## 2016-11-30 ENCOUNTER — Emergency Department (HOSPITAL_COMMUNITY)
Admission: EM | Admit: 2016-11-30 | Discharge: 2016-12-01 | Disposition: A | Discharge status: Home / Self Care | Payer: Self-pay | Attending: Emergency Medicine | Admitting: Emergency Medicine

## 2016-11-30 ENCOUNTER — Encounter (HOSPITAL_COMMUNITY): Payer: Self-pay | Admitting: *Deleted

## 2016-11-30 ENCOUNTER — Emergency Department (HOSPITAL_COMMUNITY): Payer: Self-pay

## 2016-11-30 DIAGNOSIS — F17210 Nicotine dependence, cigarettes, uncomplicated: Secondary | ICD-10-CM | POA: Insufficient documentation

## 2016-11-30 DIAGNOSIS — Z79899 Other long term (current) drug therapy: Secondary | ICD-10-CM | POA: Insufficient documentation

## 2016-11-30 DIAGNOSIS — R791 Abnormal coagulation profile: Secondary | ICD-10-CM | POA: Insufficient documentation

## 2016-11-30 DIAGNOSIS — H538 Other visual disturbances: Secondary | ICD-10-CM | POA: Insufficient documentation

## 2016-11-30 LAB — COMPREHENSIVE METABOLIC PANEL
ALK PHOS: 73 U/L (ref 38–126)
ALT: 19 U/L (ref 17–63)
ANION GAP: 15 (ref 5–15)
AST: 31 U/L (ref 15–41)
Albumin: 5.1 g/dL — ABNORMAL HIGH (ref 3.5–5.0)
BUN: 25 mg/dL — ABNORMAL HIGH (ref 6–20)
CALCIUM: 10.7 mg/dL — AB (ref 8.9–10.3)
CHLORIDE: 99 mmol/L — AB (ref 101–111)
CO2: 20 mmol/L — ABNORMAL LOW (ref 22–32)
Creatinine, Ser: 2.08 mg/dL — ABNORMAL HIGH (ref 0.61–1.24)
GFR calc non Af Amer: 39 mL/min — ABNORMAL LOW (ref >60)
GFR, EST AFRICAN AMERICAN: 45 mL/min — AB (ref >60)
Glucose, Bld: 125 mg/dL — ABNORMAL HIGH (ref 65–99)
Potassium: 3.5 mmol/L (ref 3.5–5.1)
SODIUM: 134 mmol/L — AB (ref 135–145)
Total Bilirubin: 1.3 mg/dL — ABNORMAL HIGH (ref 0.3–1.2)
Total Protein: 8.9 g/dL — ABNORMAL HIGH (ref 6.5–8.1)

## 2016-11-30 LAB — I-STAT CHEM 8, ED
BUN: 28 mg/dL — ABNORMAL HIGH (ref 6–20)
Calcium, Ion: 1.17 mmol/L (ref 1.15–1.40)
Chloride: 101 mmol/L (ref 101–111)
Creatinine, Ser: 2.1 mg/dL — ABNORMAL HIGH (ref 0.61–1.24)
GLUCOSE: 128 mg/dL — AB (ref 65–99)
HCT: 59 % — ABNORMAL HIGH (ref 39.0–52.0)
Hemoglobin: 20.1 g/dL — ABNORMAL HIGH (ref 13.0–17.0)
Potassium: 3.6 mmol/L (ref 3.5–5.1)
Sodium: 135 mmol/L (ref 135–145)
TCO2: 23 mmol/L (ref 0–100)

## 2016-11-30 LAB — CBC
HCT: 53.2 % — ABNORMAL HIGH (ref 39.0–52.0)
Hemoglobin: 19.3 g/dL — ABNORMAL HIGH (ref 13.0–17.0)
MCH: 33.8 pg (ref 26.0–34.0)
MCHC: 36.3 g/dL — AB (ref 30.0–36.0)
MCV: 93.2 fL (ref 78.0–100.0)
Platelets: 308 10*3/uL (ref 150–400)
RBC: 5.71 MIL/uL (ref 4.22–5.81)
RDW: 13 % (ref 11.5–15.5)
WBC: 21.4 10*3/uL — ABNORMAL HIGH (ref 4.0–10.5)

## 2016-11-30 LAB — CBG MONITORING, ED: Glucose-Capillary: 135 mg/dL — ABNORMAL HIGH (ref 65–99)

## 2016-11-30 LAB — I-STAT TROPONIN, ED: Troponin i, poc: 0 ng/mL (ref 0.00–0.08)

## 2016-11-30 LAB — DIFFERENTIAL
Basophils Absolute: 0 10*3/uL (ref 0.0–0.1)
Basophils Relative: 0 %
EOS ABS: 0.2 10*3/uL (ref 0.0–0.7)
Eosinophils Relative: 1 %
LYMPHS ABS: 3.2 10*3/uL (ref 0.7–4.0)
Lymphocytes Relative: 15 %
Monocytes Absolute: 1.3 10*3/uL — ABNORMAL HIGH (ref 0.1–1.0)
Monocytes Relative: 6 %
NEUTROS ABS: 16.7 10*3/uL — AB (ref 1.7–7.7)
NEUTROS PCT: 78 %

## 2016-11-30 LAB — APTT: aPTT: 38 seconds — ABNORMAL HIGH (ref 24–36)

## 2016-11-30 LAB — PROTIME-INR
INR: 0.96
Prothrombin Time: 12.7 seconds (ref 11.4–15.2)

## 2016-11-30 NOTE — ED Triage Notes (Signed)
Pt reports L side numbness which started at 0930 today, reports L blurred vision, L foot and leg numbness.  Pt also reports a sore on the bottom of his L foot.  He also c/o L shoulder and L foot pain.  Pt is ambulatory without difficulty.  Fullness feeling in his L ear is also reported.  Pt is A&Ox 4.

## 2016-11-30 NOTE — ED Provider Notes (Signed)
MSE was initiated and I personally evaluated the patient and placed orders (if any) at  8:45 PM on November 30, 2016.  Blood pressure (!) 139/96, pulse 66, resp. rate 18, SpO2 98 %.  Physical Exam  Constitutional: He is oriented to person, place, and time and well-developed, well-nourished, and in no distress. No distress.  HENT:  Head: Normocephalic and atraumatic.  Mouth/Throat: Oropharynx is clear and moist.  Left cerumen impaction.  Eyes: Conjunctivae are normal. Pupils are equal, round, and reactive to light.  Limits on lateral gaze of the left eye past midline. EOMs otherwise normal.  Neck: Normal range of motion. Neck supple.  Cardiovascular: Normal rate, regular rhythm, normal heart sounds and intact distal pulses.   Pulmonary/Chest: Effort normal and breath sounds normal. No respiratory distress.  Abdominal: Soft. He exhibits no distension. There is no tenderness.  Musculoskeletal: He exhibits no edema.  Lymphadenopathy:    He has no cervical adenopathy.  Neurological: He is alert and oriented to person, place, and time.  Endorses decreased sensation to the left face. Strength 5/5 in all extremities, but weaker on the left.  Limping gait that patient states is due to pain in the left foot. Coordination intact including heel to shin and finger to nose.  See limitation to EOM exam. Cranial nerves III-XII otherwise grossly intact. No facial droop.   Skin: Skin is warm and dry. He is not diaphoretic.  Nursing note and vitals reviewed.   The patient appears stable so that the remainder of the MSE may be completed by another provider.  Patient presents with complaint of left-sided blurred vision, difficulty moving his left eye lateral of midline, as well as left hand and left leg tingling. The symptoms began suddenly around 9:15 this morning. His tingling has improved some since onset, but is still present. He denies ever having these symptoms before.  Endorses a "growth" on the top of  the left foot, causing him pain and changing his gait.  Patient also endorses left ear fullness for the last few days.       Anselm Pancoast, PA-C 11/30/16 2128    Melene Plan, DO 12/01/16 [phone]

## 2016-11-30 NOTE — ED Notes (Signed)
Bed: WA01 Expected date:  Expected time:  Means of arrival:  Comments: No bed or monitor

## 2016-12-01 NOTE — ED Provider Notes (Signed)
[facility] DEPT Provider Note   CSN: [id number] Arrival date & time: 11/30/16  1953     History   Chief Complaint Chief Complaint  Patient presents with  . Numbness  . Eye Problem    HPI David Clay is a 36 y.o. male.  36 yo M with a chief complaint of blurry vision. States it's worse to his left eye. Improves when he closes that eye. Denies head injury denies fevers. Denies neck pain. Denies unilateral weakness. Is having paresthesias to the entire left side of his body.   The history is provided by the patient.  Eye Problem   This is a new problem. The current episode started 2 days ago. The problem occurs constantly. The problem has not changed since onset.There is a problem in the left eye. There was no injury mechanism. The pain is at a severity of 0/10. The patient is experiencing no pain. Associated symptoms include numbness. Pertinent negatives include no discharge and no vomiting. He has tried nothing for the symptoms. The treatment provided no relief.    No past medical history on file.  There are no active problems to display for this patient.   No past surgical history on file.     Home Medications    Prior to Admission medications   Medication Sig Start Date End Date Taking? Authorizing Provider  clotrimazole (LOTRIMIN) 1 % cream Apply to affected area 2 times daily Patient not taking: Reported on 11/30/2016 04/23/16   Arthor Captain, PA-C  hydrOXYzine (ATARAX/VISTARIL) 25 MG tablet Take 1 tablet (25 mg total) by mouth every 6 (six) hours. Patient not taking: Reported on 11/30/2016 09/24/16   Charlestine Night, PA-C  permethrin (ELIMITE) 5 % cream Apply to affected area once Patient not taking: Reported on 11/30/2016 04/23/16   Arthor Captain, PA-C  predniSONE (DELTASONE) 50 MG tablet Take 1 tablet (50 mg total) by mouth daily. Patient not taking: Reported on 11/30/2016 09/24/16   Charlestine Night, PA-C  triamcinolone cream (KENALOG) 0.1 % Apply 1  application topically 2 (two) times daily. Patient not taking: Reported on 11/30/2016 04/23/16   Arthor Captain, PA-C    Family History No family history on file.  Social History Social History  Substance Use Topics  . Smoking status: Current Every Day Smoker    Types: Cigarettes  . Smokeless tobacco: Never Used  . Alcohol use Yes     Allergies   Patient has no known allergies.   Review of Systems Review of Systems  Constitutional: Negative for chills and fever.  HENT: Negative for congestion and facial swelling.   Eyes: Positive for visual disturbance. Negative for discharge.  Respiratory: Negative for shortness of breath.   Cardiovascular: Negative for chest pain and palpitations.  Gastrointestinal: Negative for abdominal pain, diarrhea and vomiting.  Musculoskeletal: Negative for arthralgias and myalgias.  Skin: Negative for color change and rash.  Neurological: Positive for numbness. Negative for tremors, syncope and headaches.  Psychiatric/Behavioral: Negative for confusion and dysphoric mood.     Physical Exam Updated Vital Signs BP 127/77 (BP Location: Left Arm)   Pulse (!) 59   Temp 98.1 F (36.7 C) (Oral)   Resp 18   SpO2 100%   Physical Exam  Constitutional: He is oriented to person, place, and time. He appears well-developed and well-nourished.  HENT:  Head: Normocephalic and atraumatic.  Eyes: EOM are normal. Pupils are equal, round, and reactive to light.  Neck: Normal range of motion. Neck supple. No JVD present.  Cardiovascular:  Normal rate and regular rhythm.  Exam reveals no gallop and no friction rub.   No murmur heard. Pulmonary/Chest: No respiratory distress. He has no wheezes.  Abdominal: He exhibits no distension and no mass. There is no tenderness. There is no rebound and no guarding.  Musculoskeletal: Normal range of motion.  Neurological: He is alert and oriented to person, place, and time.  Left sided weakness initially, then improved  with encouragement.  Subjective L sided facial weakness.   Painful gait.    The patient has extraocular motor intact without testing. I rule out try and have him follow my finger he stops at midline with both eyes when I go to the left.  Skin: No rash noted. No pallor.  Psychiatric: He has a normal mood and affect. His behavior is normal.  Nursing note and vitals reviewed.    ED Treatments / Results  Labs (all labs ordered are listed, but only abnormal results are displayed) Labs Reviewed  APTT - Abnormal; Notable for the following:       Result Value   aPTT 38 (*)    All other components within normal limits  CBC - Abnormal; Notable for the following:    WBC 21.4 (*)    Hemoglobin 19.3 (*)    HCT 53.2 (*)    MCHC 36.3 (*)    All other components within normal limits  DIFFERENTIAL - Abnormal; Notable for the following:    Neutro Abs 16.7 (*)    Monocytes Absolute 1.3 (*)    All other components within normal limits  COMPREHENSIVE METABOLIC PANEL - Abnormal; Notable for the following:    Sodium 134 (*)    Chloride 99 (*)    CO2 20 (*)    Glucose, Bld 125 (*)    BUN 25 (*)    Creatinine, Ser 2.08 (*)    Calcium 10.7 (*)    Total Protein 8.9 (*)    Albumin 5.1 (*)    Total Bilirubin 1.3 (*)    GFR calc non Af Amer 39 (*)    GFR calc Af Amer 45 (*)    All other components within normal limits  CBG MONITORING, ED - Abnormal; Notable for the following:    Glucose-Capillary 135 (*)    All other components within normal limits  I-STAT CHEM 8, ED - Abnormal; Notable for the following:    BUN 28 (*)    Creatinine, Ser 2.10 (*)    Glucose, Bld 128 (*)    Hemoglobin 20.1 (*)    HCT 59.0 (*)    All other components within normal limits  PROTIME-INR  I-STAT TROPOININ, ED    EKG  EKG Interpretation  Date/Time:  Tuesday November 30 2016 21:10:02 EDT Ventricular Rate:  69 PR Interval:    QRS Duration: 92 QT Interval:  364 QTC Calculation: 390 R Axis:   75 Text  Interpretation:  Sinus arrhythmia Right atrial enlargement Abnormal R-wave progression, early transition ST elevation, consider inferior injury No significant change since last tracing Confirmed by Reshad Saab MD, DANIEL [phone]) on 11/30/2016 11:19:00 PM       Radiology Ct Head Wo Contrast  Result Date: 11/30/2016 CLINICAL DATA:  36 year old male with history of left-sided numbness since 09:30 this morning, with left blurry vision and left foot and leg numbness. EXAM: CT HEAD WITHOUT CONTRAST TECHNIQUE: Contiguous axial images were obtained from the base of the skull through the vertex without intravenous contrast. COMPARISON:  Head CT 01/16/2014. FINDINGS: Brain: No evidence  of acute infarction, hemorrhage, hydrocephalus, extra-axial collection or mass lesion/mass effect. Vascular: No hyperdense vessel or unexpected calcification. Skull: Normal. Negative for fracture or focal lesion. Sinuses/Orbits: No acute finding. Other: None. IMPRESSION: 1. No acute intracranial abnormalities. The appearance of the brain is normal. Electronically Signed   By: Trudie Reed M.D.   On: 11/30/2016 21:24    Procedures Procedures (including critical care time)  Medications Ordered in ED Medications - No data to display   Initial Impression / Assessment and Plan / ED Course  I have reviewed the triage vital signs and the nursing notes.  Pertinent labs & imaging results that were available during my care of the patient were reviewed by me and considered in my medical decision making (see chart for details).     36 yo M With a chief complaint of left eye blurry vision. Patient with an inconsistent neurologic exam. He has full range of motion when he is not being tested with his extraocular motor. Upon being tested he insists that he cannot look to the left with either eye. I discussed this with Dr. Georg Ruddle, neuro.  recommended no further emergent workup at this time. Discharge home.  12:37 AM:  I have discussed  the diagnosis/risks/treatment options with the patient and family and believe the pt to be eligible for discharge home to follow-up with PCP. We also discussed returning to the ED immediately if new or worsening sx occur. We discussed the sx which are most concerning (e.g., sudden worsening pain, fever, inability to tolerate by mouth) that necessitate immediate return. Medications administered to the patient during their visit and any new prescriptions provided to the patient are listed below.  Medications given during this visit Medications - No data to display   The patient appears reasonably screen and/or stabilized for discharge and I doubt any other medical condition or other [facility] requiring further screening, evaluation, or treatment in the ED at this time prior to discharge.    Final Clinical Impressions(s) / ED Diagnoses   Final diagnoses:  Blurry vision, left eye    New Prescriptions New Prescriptions   No medications on file     Melene Plan, DO 12/01/16 0037
